# Patient Record
Sex: Male | Born: 2000
Health system: Southern US, Community
[De-identification: ages and names within clinical notes are randomized; demographics above are authoritative.]

## PROBLEM LIST (undated history)

## (undated) DIAGNOSIS — F32A Depression, unspecified: Secondary | ICD-10-CM

## (undated) DIAGNOSIS — J301 Allergic rhinitis due to pollen: Secondary | ICD-10-CM

## (undated) DIAGNOSIS — F329 Major depressive disorder, single episode, unspecified: Secondary | ICD-10-CM

## (undated) DIAGNOSIS — K219 Gastro-esophageal reflux disease without esophagitis: Secondary | ICD-10-CM

## (undated) HISTORY — PX: TONSILLECTOMY AND ADENOIDECTOMY: SUR1326

## (undated) HISTORY — PX: TONSILLECTOMY: SUR1361

## (undated) HISTORY — DX: Depression, unspecified: F32.A

## (undated) HISTORY — DX: Allergic rhinitis due to pollen: J30.1

## (undated) HISTORY — PX: TYMPANOSTOMY TUBE PLACEMENT: SHX32

## (undated) HISTORY — DX: Gastro-esophageal reflux disease without esophagitis: K21.9

---
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
## 2019-01-29 ENCOUNTER — Telehealth: Payer: Self-pay | Admitting: Pediatrics

## 2019-01-29 NOTE — Telephone Encounter (Signed)
Okay to set him up sooner if you can find a spot (I will add on tomorrow --if you don't need to time for other visits)

## 2019-01-29 NOTE — Telephone Encounter (Signed)
I spoke to patient's mother and scheduled appointment on 02/02/19.

## 2019-01-29 NOTE — Telephone Encounter (Signed)
Best number [phone] Mom Walker Shadow ) called   Pt has new patient appointment with you 7/14 and wanted to know if he could be see sooner.  Mom concerned about pt having depression.

## 2019-02-02 ENCOUNTER — Encounter: Payer: Self-pay | Admitting: Internal Medicine

## 2019-02-02 ENCOUNTER — Encounter (INDEPENDENT_AMBULATORY_CARE_PROVIDER_SITE_OTHER): Payer: Self-pay

## 2019-02-02 ENCOUNTER — Other Ambulatory Visit: Payer: Self-pay

## 2019-02-02 ENCOUNTER — Other Ambulatory Visit (HOSPITAL_COMMUNITY): Payer: Self-pay | Admitting: Gastroenterology

## 2019-02-02 ENCOUNTER — Ambulatory Visit (INDEPENDENT_AMBULATORY_CARE_PROVIDER_SITE_OTHER): Payer: 59 | Admitting: Internal Medicine

## 2019-02-02 DIAGNOSIS — Z0000 Encounter for general adult medical examination without abnormal findings: Secondary | ICD-10-CM | POA: Diagnosis not present

## 2019-02-02 DIAGNOSIS — K219 Gastro-esophageal reflux disease without esophagitis: Secondary | ICD-10-CM | POA: Diagnosis not present

## 2019-02-02 DIAGNOSIS — F330 Major depressive disorder, recurrent, mild: Secondary | ICD-10-CM | POA: Diagnosis not present

## 2019-02-02 DIAGNOSIS — R1011 Right upper quadrant pain: Secondary | ICD-10-CM

## 2019-02-02 DIAGNOSIS — F339 Major depressive disorder, recurrent, unspecified: Secondary | ICD-10-CM | POA: Insufficient documentation

## 2019-02-02 MED ORDER — FLUOXETINE HCL 20 MG PO TABS: 20.0000 mg | ORAL_TABLET | Freq: Every day | ORAL | 3 refills | Status: DC

## 2019-02-02 NOTE — Patient Instructions (Signed)
Please start the fluoxetine 20mg  daily. Let me know if you have problems or can't keep taking it. Stop it if you have thoughts of suicide. Bring in a copy of your recent blood work to the next appointment

## 2019-02-02 NOTE — Assessment & Plan Note (Signed)
Healthy Td probably due in 1-2 years Recommended yearly flu vaccine Just had blood work---probably should be rescreened for HIV

## 2019-02-02 NOTE — Progress Notes (Signed)
Subjective:    Patient ID: Tyler Sosa, male    DOB: [birth year], 18 y.o.   MRN: [id number]  HPI Visit to establish care and evaluate depression  Hasn't seen doctor for some years Feels he needs an antidepressant Feels lonely at times---"I over exaggerate" Feels depressed at times---not happy even when things are good At least a couple of times a week Started 2 months ago---really stressed out about stuff Some mood issues in the past---even worse in high school  Graduated high school Went to [employer]. 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 "It would be easier, so I don't have worry about stuff". Happens a couple times a week  In relationship This is positive---5 months now No sexual problems --she is on birth control (no condoms) Was tested for HIV 1-2 years ago No drugs. Rare alcohol. Smoke Jule for about a month---nothing since  No current outpatient medications on file prior to visit.   No current facility-administered medications on file prior to visit.     No Known Allergies  Past Medical History:  Diagnosis Date  . Depression   . GERD (gastroesophageal reflux disease)   . Seasonal allergic rhinitis due to pollen     Past Surgical History:  Procedure Laterality Date  . TONSILLECTOMY    . TONSILLECTOMY AND ADENOIDECTOMY  ~2004  . TYMPANOSTOMY TUBE PLACEMENT      Family History  Problem Relation Age of Onset  . Asthma Mother   . Melanoma Mother   . Hyperlipidemia Father   . Asthma Sister   . Hearing loss Sister     Social History   Socioeconomic History  . Marital status: Single    Spouse name: Not on file  . Number of children: Not on file  .  Years of education: Not on file  . Highest education level: Not on file  Occupational History  . Not on file  Social Needs  . Financial resource strain: Not on file  . Food insecurity    Worry: Not on file    Inability: Not on file  . Transportation needs    Medical: Not on file    Non-medical: Not on file  Tobacco Use  . Smoking status: Never Smoker  . Smokeless tobacco: Never Used  Substance and Sexual Activity  . Alcohol use: No  . Drug use: Not on file  . Sexual activity: Not on file  Lifestyle  . Physical activity    Days per week: Not on file    Minutes per session: Not on file  . Stress: Not on file  Relationships  . Social Herbalist on phone: Not on file    Gets together: Not on file    Attends religious service: Not on file    Active member of club or organization: Not on file    Attends meetings of clubs or organizations: Not on file    Relationship status: Not on file  . Intimate partner violence    Fear of current or ex partner: Not on file    Emotionally abused: Not on file    Physically abused: Not on file    Forced sexual activity: Not on file  Other Topics Concern  . Not on file  Social History Narrative  . Not on file   Review of Systems  Constitutional:       Appetite is variable Weight is up 15# since graduating high school Wears seat belt sporadically  HENT: Negative for dental problem and hearing loss.   Eyes: Negative for visual disturbance.  Respiratory: Negative for cough, chest tightness and shortness of breath.   Cardiovascular: Negative for palpitations and leg swelling.       Some nights ---"like a needle" in upper left chest Saw GI yesterday--told to drink more water (Dr Loreta AveMann)  Gastrointestinal: Positive for constipation. Negative for blood in stool.       Saw GI due to the constipation  Endocrine: Negative for polydipsia and polyuria.  Genitourinary: Negative for difficulty urinating and urgency.  Musculoskeletal:  Negative for arthralgias, gait problem and joint swelling.  Skin: Negative for rash.  Allergic/Immunologic: Positive for environmental allergies. Negative for immunocompromised state.       Loratadine helps  Neurological: Positive for headaches. Negative for dizziness, syncope and light-headedness.  Hematological: Negative for adenopathy. Does not bruise/bleed easily.  Psychiatric/Behavioral: Positive for dysphoric mood. Negative for sleep disturbance. The patient is nervous/anxious.        Objective:   Physical Exam  Constitutional: He is oriented to person, place, and time. He appears well-developed. No distress.  HENT:  Head: Normocephalic and atraumatic.  Right Ear: External ear normal.  Left Ear: External ear normal.  Mouth/Throat: Oropharynx is clear and moist. No oropharyngeal exudate.  Eyes: Pupils are equal, round, and reactive to light. Conjunctivae are normal.  Neck: No thyromegaly present.  Cardiovascular: Normal rate, normal heart sounds and intact distal pulses. Exam reveals no gallop.  No murmur heard. Respiratory: Effort normal and breath sounds normal. No respiratory distress. He has no wheezes. He has no rales.  GI: Soft. There is no abdominal tenderness.  Genitourinary:    Genitourinary Comments: Normal testes   Musculoskeletal:        General: No tenderness or edema.  Lymphadenopathy:    He has no cervical adenopathy.  Neurological: He is alert and oriented to person, place, and time.  Skin: No rash noted. No erythema.  Psychiatric:  No clear depression Affect appropriate No thought process disturbances           Assessment & Plan:

## 2019-02-02 NOTE — Assessment & Plan Note (Signed)
Had more serious issues in high school but wasn't treated No mania Anxious as well Suicidal thought but no ideation---asked him to secure weapons in his home but he will not (does live with parents) Will start fluoxetine and see back soon

## 2019-02-12 ENCOUNTER — Ambulatory Visit (HOSPITAL_COMMUNITY)
Admission: RE | Admit: 2019-02-12 | Discharge: 2019-02-12 | Disposition: A | Discharge status: Home / Self Care | Payer: 59 | Source: Ambulatory Visit | Attending: Gastroenterology | Admitting: Gastroenterology

## 2019-02-12 DIAGNOSIS — R1011 Right upper quadrant pain: Secondary | ICD-10-CM | POA: Diagnosis present

## 2019-02-20 ENCOUNTER — Ambulatory Visit: Payer: 59 | Admitting: Internal Medicine

## 2019-02-20 ENCOUNTER — Encounter: Payer: 59 | Admitting: Internal Medicine

## 2019-06-27 ENCOUNTER — Other Ambulatory Visit: Payer: Self-pay

## 2019-06-27 DIAGNOSIS — Z20822 Contact with and (suspected) exposure to covid-19: Secondary | ICD-10-CM

## 2019-06-28 ENCOUNTER — Encounter: Payer: Self-pay | Admitting: Emergency Medicine

## 2019-06-28 ENCOUNTER — Other Ambulatory Visit: Payer: Self-pay

## 2019-06-28 ENCOUNTER — Telehealth: Payer: Self-pay

## 2019-06-28 ENCOUNTER — Ambulatory Visit
Admission: EM | Admit: 2019-06-28 | Discharge: 2019-06-28 | Disposition: A | Discharge status: Home / Self Care | Payer: 59 | Attending: Emergency Medicine | Admitting: Emergency Medicine

## 2019-06-28 DIAGNOSIS — R05 Cough: Secondary | ICD-10-CM | POA: Diagnosis not present

## 2019-06-28 DIAGNOSIS — L239 Allergic contact dermatitis, unspecified cause: Secondary | ICD-10-CM

## 2019-06-28 DIAGNOSIS — R059 Cough, unspecified: Secondary | ICD-10-CM

## 2019-06-28 MED ORDER — GUAIFENESIN ER 600 MG PO TB12: 1200.0000 mg | ORAL_TABLET | Freq: Two times a day (BID) | ORAL | 0 refills | Status: DC | PRN

## 2019-06-28 MED ORDER — PREDNISONE 10 MG (21) PO TBPK: ORAL_TABLET | Freq: Every day | ORAL | 0 refills | Status: DC

## 2019-06-28 NOTE — Telephone Encounter (Signed)
Okay Sounds like it might be a contact dermatitis. Will await the visit information from the urgent care

## 2019-06-28 NOTE — Telephone Encounter (Signed)
Pt's mom said; starting last night pt has bumpy red rash on face and lt eye,chest,waistline,neck,thigh and penis. No difficulty breathing and no swelling with lips,tongue,mouth or throat. Pt did cut wood 5 days ago. Not sure if looks like poison oak or ivy. All areas with rash are itching. Pt just took benadryl. Pt had covid testing on 06/27/19 due to cough and congestion; no results yet. Pt has dry cough,runny nose,H/A,S/T, eye is red on outside and pink eye in corner of lt eye. pts mom is going to take pt to [facility] in [place] now.FYI to Dr Silvio Pate.

## 2019-06-28 NOTE — ED Provider Notes (Signed)
Renaldo Fiddler    CSN: [id number] Arrival date & time: 06/28/19  1410      History   Chief Complaint Chief Complaint  Patient presents with  . Rash  . Cough    HPI Tyler Sosa is a 18 y.o. male.  Patient presents with pruritic rash on his face and chest x 4 days.  He states the rash began after he was in the woods.  He also reports nasal congestion and a nonproductive cough.  He had a COVID test done yesterday and the result pending.  He denies fever, chills, ear pain, sore throat, shortness of breath, vomiting, diarrhea, or other symptoms.  His mother reports she gave him 1 dose of Benadryl today.  Prior to that, he was treating his rash with rubbing alcohol.  No treatments attempted for congestion or cough.    The history is provided by the patient and a parent.    Past Medical History:  Diagnosis Date  . Depression   . GERD (gastroesophageal reflux disease)   . Seasonal allergic rhinitis due to pollen     Patient Active Problem List   Diagnosis Date Noted  . Preventative health care 02/02/2019  . Recurrent major depression (HCC) 02/02/2019  . GERD (gastroesophageal reflux disease)     Past Surgical History:  Procedure Laterality Date  . TONSILLECTOMY    . TONSILLECTOMY AND ADENOIDECTOMY  ~2004  . TYMPANOSTOMY TUBE PLACEMENT         Home Medications    Prior to Admission medications   Medication Sig Start Date End Date Taking? Authorizing Provider  FLUoxetine (PROZAC) 20 MG tablet Take 1 tablet (20 mg total) by mouth daily. 02/02/19   Karie Schwalbe, MD  guaiFENesin (MUCINEX) 600 MG 12 hr tablet Take 2 tablets (1,200 mg total) by mouth 2 (two) times daily as needed. 06/28/19   Mickie Bail, NP  predniSONE (STERAPRED UNI-PAK 21 TAB) 10 MG (21) TBPK tablet Take by mouth daily. Take 6 tabs by mouth daily  for 1 day, then 5 tabs for 1 day, then 4 tabs for 1 day, then 3 tabs for 1 day, 2 tabs for 1 day, then 1 tab by mouth daily for 1 day 06/28/19    Mickie Bail, NP    Family History Family History  Problem Relation Age of Onset  . Asthma Mother   . Melanoma Mother   . Hyperlipidemia Father   . Asthma Sister   . Hearing loss Sister     Social History Social History   Tobacco Use  . Smoking status: Never Smoker  . Smokeless tobacco: Never Used  Substance Use Topics  . Alcohol use: Yes    Comment: rare beer  . Drug use: Never     Allergies   Patient has no known allergies.   Review of Systems Review of Systems  Constitutional: Negative for chills and fever.  HENT: Positive for congestion. Negative for ear pain and sore throat.   Eyes: Negative for pain and visual disturbance.  Respiratory: Positive for cough. Negative for shortness of breath.   Cardiovascular: Negative for chest pain and palpitations.  Gastrointestinal: Negative for abdominal pain, diarrhea, nausea and vomiting.  Genitourinary: Negative for dysuria and hematuria.  Musculoskeletal: Negative for arthralgias and back pain.  Skin: Positive for rash. Negative for color change.  Neurological: Negative for seizures and syncope.  All other systems reviewed and are negative.    Physical Exam Triage Vital Signs ED Triage Vitals  Enc Vitals Group     BP      Pulse      Resp      Temp      Temp src      SpO2      Weight      Height      Head Circumference      Peak Flow      Pain Score      Pain Loc      Pain Edu?      Excl. in GC?    No data found.  Updated Vital Signs BP 126/79   Pulse 92   Temp 98 F (36.7 C) (Oral)   Resp 18   Wt 172 lb (78 kg)   SpO2 96%   BMI 26.94 kg/m   Visual Acuity Right Eye Distance:   Left Eye Distance:   Bilateral Distance:    Right Eye Near:   Left Eye Near:    Bilateral Near:     Physical Exam Vitals signs and nursing note reviewed.  Constitutional:      General: He is not in acute distress.    Appearance: He is well-developed. He is not ill-appearing.  HENT:     Head: Normocephalic  and atraumatic.     Right Ear: Tympanic membrane normal.     Left Ear: Tympanic membrane normal.     Nose: Nose normal.     Mouth/Throat:     Mouth: Mucous membranes are moist.     Pharynx: Oropharynx is clear.  Eyes:     Conjunctiva/sclera: Conjunctivae normal.  Neck:     Musculoskeletal: Neck supple.  Cardiovascular:     Rate and Rhythm: Normal rate and regular rhythm.     Heart sounds: No murmur.  Pulmonary:     Effort: Pulmonary effort is normal. No respiratory distress.     Breath sounds: Normal breath sounds. No wheezing or rhonchi.  Abdominal:     General: Bowel sounds are normal.     Palpations: Abdomen is soft.     Tenderness: There is no abdominal tenderness. There is no guarding or rebound.  Skin:    General: Skin is warm and dry.     Findings: Rash present.     Comments: Pink linear papular/plaque rash on facial cheeks, forehead, and upper chest.  No drainage.  No lesions in eyes or mouth.    Neurological:     General: No focal deficit present.     Mental Status: He is alert and oriented to person, place, and time.     Sensory: No sensory deficit.     Motor: No weakness.     Gait: Gait normal.      UC Treatments / Results  Labs (all labs ordered are listed, but only abnormal results are displayed) Labs Reviewed - No data to display  EKG   Radiology No results found.  Procedures Procedures (including critical care time)  Medications Ordered in UC Medications - No data to display  Initial Impression / Assessment and Plan / UC Course  I have reviewed the triage vital signs and the nursing notes.  Pertinent labs & imaging results that were available during my care of the patient were reviewed by me and considered in my medical decision making (see chart for details).    Allergic contact dermatitis.  Cough.  Treating dermatitis with prednisone and Benadryl.  Treating cough with Mucinex.  Instructed patient to continue to self quarantine until his COVID  test result is back.  Instructed him to follow-up with his PCP or return here if his symptoms or not improving.  Patient agrees to plan of care.     Final Clinical Impressions(s) / UC Diagnoses   Final diagnoses:  Allergic contact dermatitis, unspecified trigger  Cough     Discharge Instructions     Take the prednisone as directed.  You also should take Benadryl for your rash.  Remember that Benadryl may cause drowsiness so do not drive, operate machinery, or drink alcohol with this medication.    Take the Mucinex as needed for your cough.  Continue to self quarantine until your COVID test result is back.    Follow-up with your primary care provider or return here if your symptoms are not improving.       ED Prescriptions    Medication Sig Dispense Auth. Provider   guaiFENesin (MUCINEX) 600 MG 12 hr tablet Take 2 tablets (1,200 mg total) by mouth 2 (two) times daily as needed. 12 tablet Sharion Balloon, NP   predniSONE (STERAPRED UNI-PAK 21 TAB) 10 MG (21) TBPK tablet Take by mouth daily. Take 6 tabs by mouth daily  for 1 day, then 5 tabs for 1 day, then 4 tabs for 1 day, then 3 tabs for 1 day, 2 tabs for 1 day, then 1 tab by mouth daily for 1 day 21 tablet Sharion Balloon, NP     PDMP not reviewed this encounter.   Sharion Balloon, NP 06/28/19 1451

## 2019-06-28 NOTE — Discharge Instructions (Signed)
Take the prednisone as directed.  You also should take Benadryl for your rash.  Remember that Benadryl may cause drowsiness so do not drive, operate machinery, or drink alcohol with this medication.    Take the Mucinex as needed for your cough.  Continue to self quarantine until your COVID test result is back.    Follow-up with your primary care provider or return here if your symptoms are not improving.

## 2019-06-28 NOTE — ED Triage Notes (Signed)
Patient in office with mom c/o ? Poison Ivy always go into the woods hunting also complains of cough/cough had covid do yesterday @ [facility]  BTY:OMAYOKHT  Denies:Fever

## 2019-06-29 ENCOUNTER — Telehealth: Payer: Self-pay | Admitting: *Deleted

## 2019-06-29 NOTE — Telephone Encounter (Signed)
Patient called for results ,advised to call back due results still pending.

## 2019-06-30 LAB — NOVEL CORONAVIRUS, NAA: SARS-CoV-2, NAA: NOT DETECTED

## 2020-03-16 IMAGING — US ULTRASOUND ABDOMEN COMPLETE
1 series · 14 of 25 positions shown · non-contrast
Comparison: None.

CLINICAL DATA: Right upper quadrant pain

EXAM:
ULTRASOUND ABDOMEN LIMITED RIGHT UPPER QUADRANT

[Series 1: ultrasound abdomen complete · 14 of 35 slices shown]
[im 1/35]
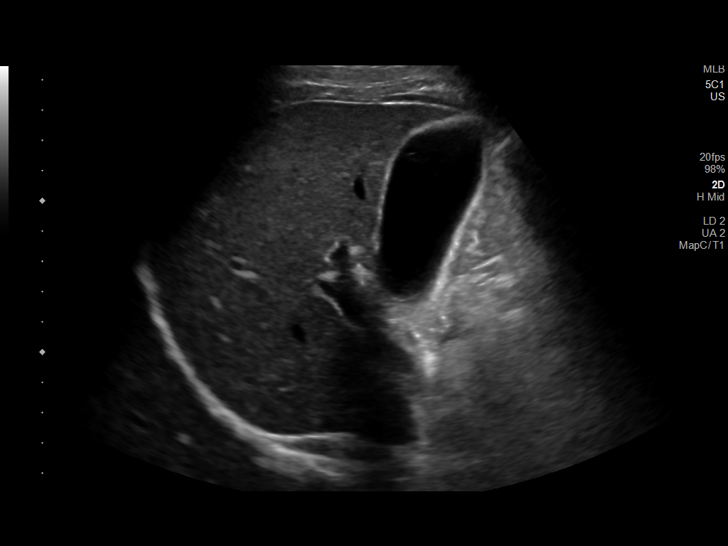
[im 3/35]
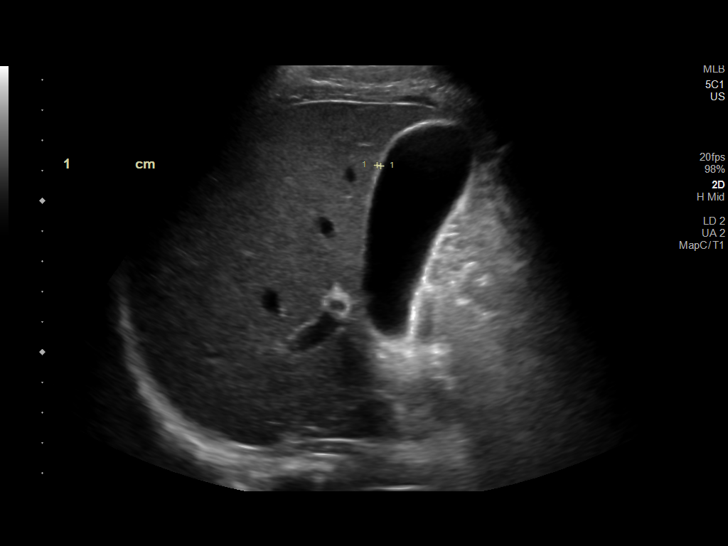
[im 6/35]
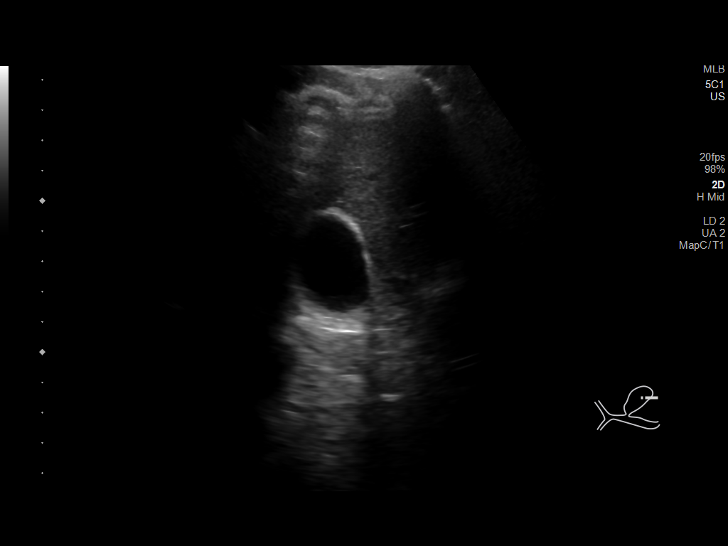
[im 9/35]
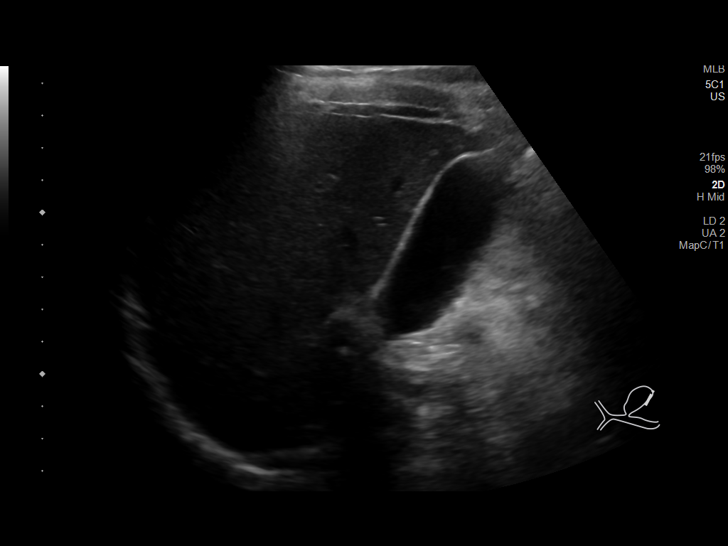
[im 12/35]
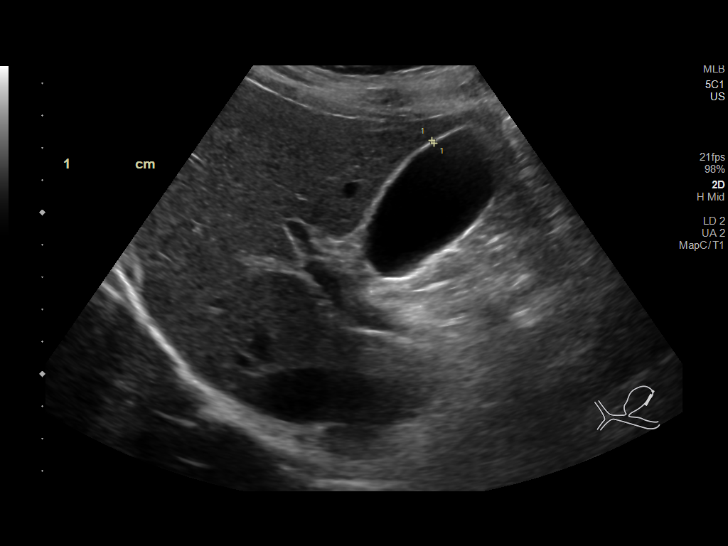
[im 13/35]
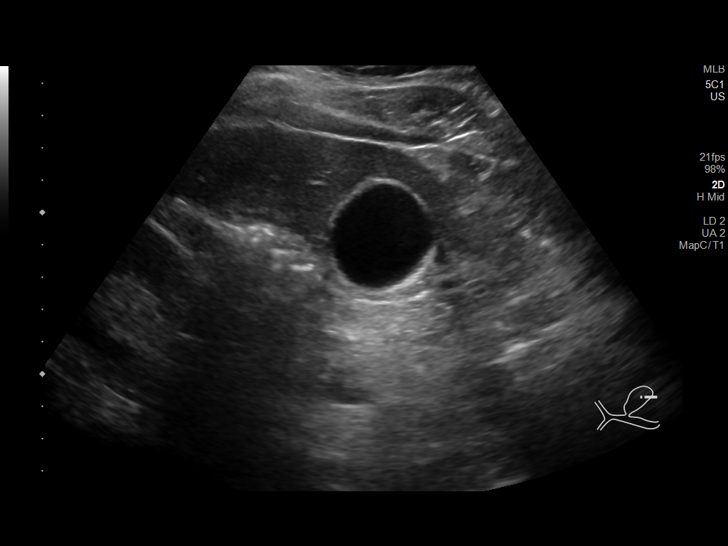
[im 16/35]
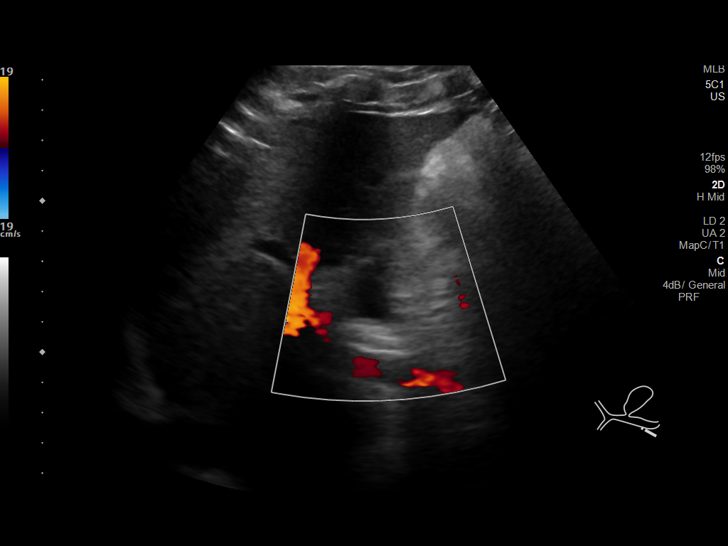
[im 19/35]
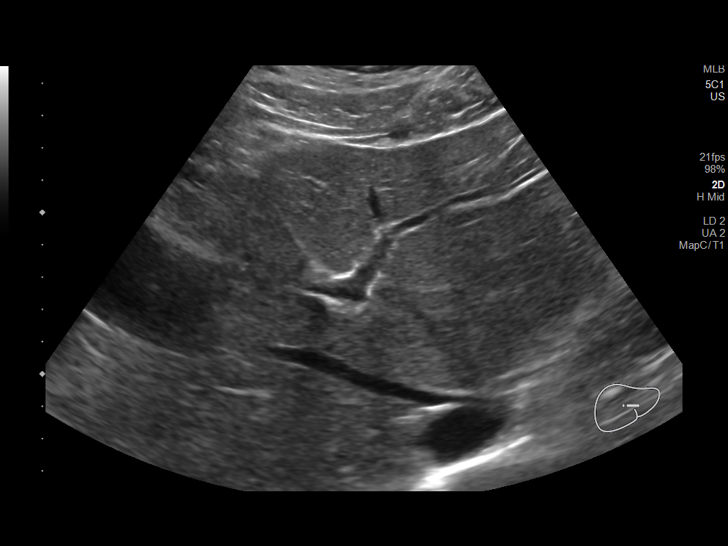
[im 22/35]
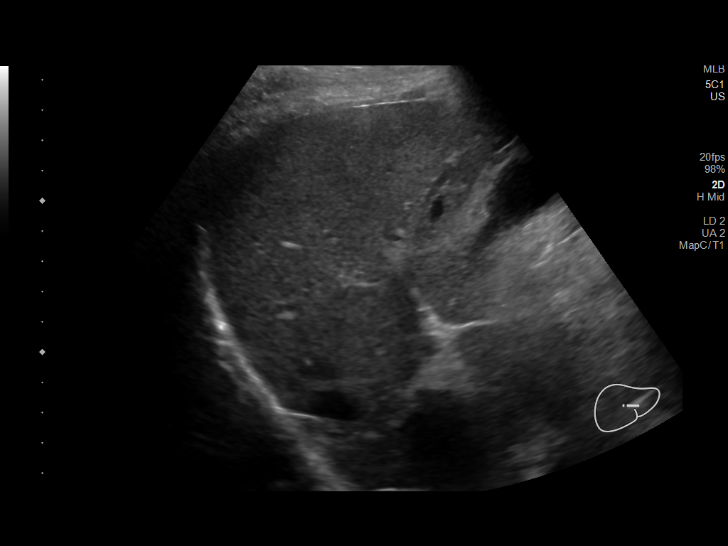
[im 23/35]
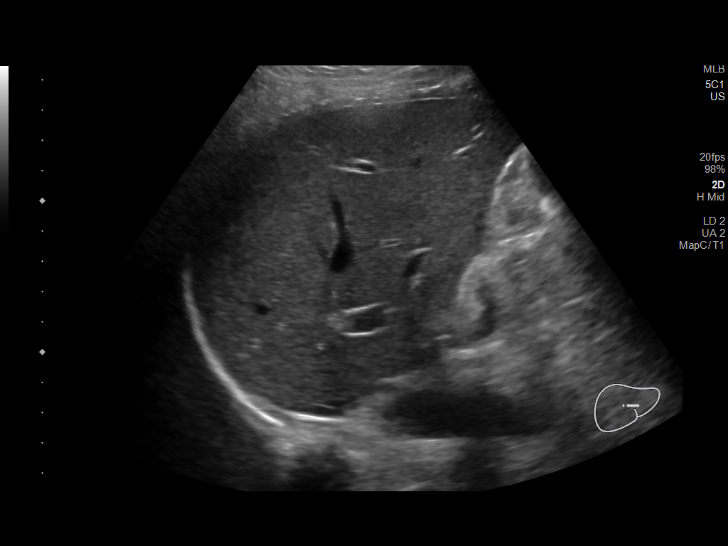
[im 26/35]
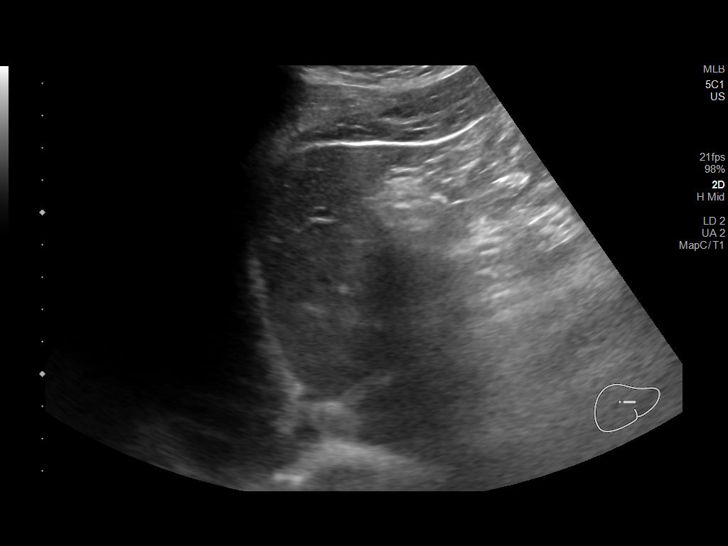
[im 29/35]
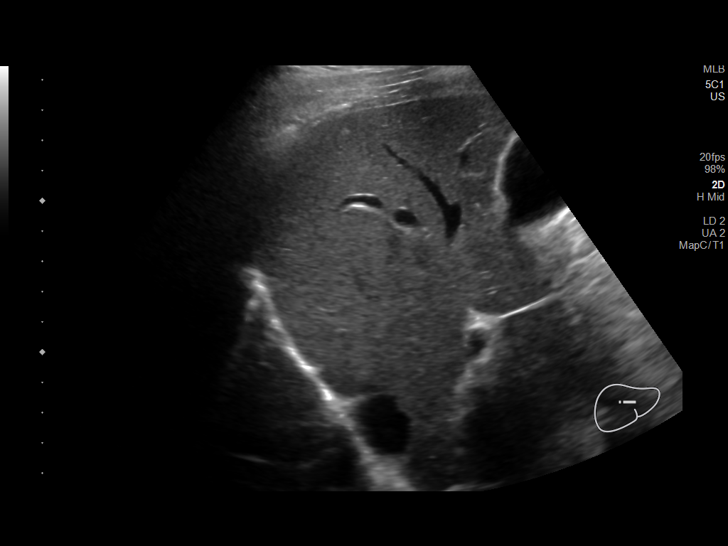
[im 32/35]
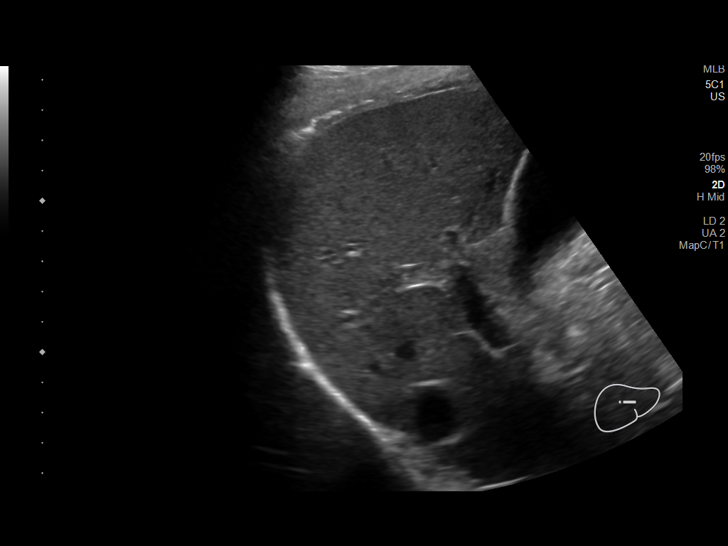
[im 35/35]
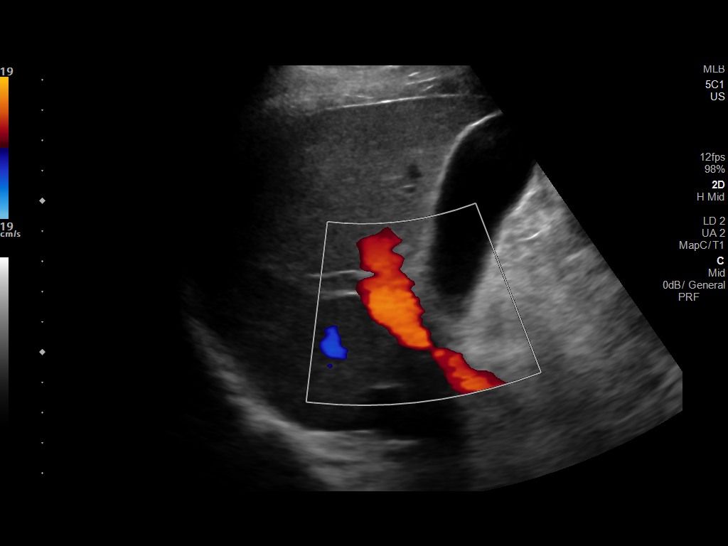

[14 of 25 positions shown; findings below may reference images not displayed]

FINDINGS: Gallbladder:

No gallstones or wall thickening visualized. There is no
pericholecystic fluid no sonographic Murphy sign noted by
sonographer.

Common bile duct:

Diameter: 4 mm. No intrahepatic or extrahepatic biliary duct
dilatation.

Liver:

No focal lesion identified. Within normal limits in parenchymal
echogenicity. Portal vein is patent on color Doppler imaging with
normal direction of blood flow towards the liver.
IMPRESSION: Study within normal limits.

## 2020-10-13 DIAGNOSIS — S63642A Sprain of metacarpophalangeal joint of left thumb, initial encounter: Secondary | ICD-10-CM | POA: Insufficient documentation

## 2021-10-06 ENCOUNTER — Ambulatory Visit (INDEPENDENT_AMBULATORY_CARE_PROVIDER_SITE_OTHER): Payer: 59 | Admitting: Internal Medicine

## 2021-10-06 ENCOUNTER — Other Ambulatory Visit: Payer: Self-pay

## 2021-10-06 ENCOUNTER — Encounter: Payer: Self-pay | Admitting: Internal Medicine

## 2021-10-06 VITALS — BP 104/68 | HR 78 | Temp 97.8°F | Ht 68.0 in | Wt 187.0 lb

## 2021-10-06 DIAGNOSIS — Z0000 Encounter for general adult medical examination without abnormal findings: Secondary | ICD-10-CM | POA: Diagnosis not present

## 2021-10-06 DIAGNOSIS — Z23 Encounter for immunization: Secondary | ICD-10-CM | POA: Diagnosis not present

## 2021-10-06 NOTE — Assessment & Plan Note (Signed)
Healthy No problems with proceeding with BLET program Forms done Stays fit Prefers no COVID vaccines--or flu vaccines Td today

## 2021-10-06 NOTE — Progress Notes (Signed)
Subjective:    Patient ID: Tyler Sosa, male    DOB: [birth year], 21 y.o.   MRN: [id number]  HPI Here for physical  Plans to start BLET at [employer] In application process for [facility] Department  No recurrence of mood problems Currently at [employer] on line---Bachelor's in Criminal Justice Part time landscaping No sexuality issues Still living with parents  No current outpatient medications on file prior to visit.   No current facility-administered medications on file prior to visit.    No Known Allergies  Past Medical History:  Diagnosis Date   Depression    GERD (gastroesophageal reflux disease)    Seasonal allergic rhinitis due to pollen     Past Surgical History:  Procedure Laterality Date   TONSILLECTOMY     TONSILLECTOMY AND ADENOIDECTOMY  ~2004   TYMPANOSTOMY TUBE PLACEMENT      Family History  Problem Relation Age of Onset   Asthma Mother    Melanoma Mother    Hyperlipidemia Father    Asthma Sister    Hearing loss Sister     Social History   Socioeconomic History   Marital status: Single    Spouse name: Not on file   Number of children: Not on file   Years of education: Not on file   Highest education level: Not on file  Occupational History   Not on file  Tobacco Use   Smoking status: Never    Passive exposure: Never   Smokeless tobacco: Never  Substance and Sexual Activity   Alcohol use: Yes    Comment: rare beer   Drug use: Never   Sexual activity: Yes    Partners: Female  Other Topics Concern   Not on file  Social History Narrative   Lives with mom and step dad   Doesn't really know his biologic dad anymore   1 sister   Hopes to go into Patent examiner   Social Determinants of Health   Financial Resource Strain: Not on file  Food Insecurity: Not on file  Transportation Needs: Not on file  Physical Activity: Not on file  Stress: Not on file  Social Connections: Not on file  Intimate Partner Violence: Not on file    Review of Systems  Constitutional:  Negative for fatigue and unexpected weight change.       Works out regularly---runs and [employer] Wears seat belt  HENT:  Negative for dental problem, hearing loss, tinnitus and trouble swallowing.        Regular with dentist   Eyes:  Negative for visual disturbance.       No diplopia or unilateral vision loss   Respiratory:  Negative for cough, chest tightness and shortness of breath.   Cardiovascular:  Negative for chest pain, palpitations and leg swelling.  Gastrointestinal:  Negative for blood in stool and constipation.       Occasional heartburn--- famotidine prn (not often)  Endocrine: Negative for polydipsia and polyuria.  Genitourinary:  Negative for difficulty urinating and dysuria.       No sexual problems---monogamous   Musculoskeletal:  Negative for arthralgias, back pain and joint swelling.  Skin:  Negative for rash.  Allergic/Immunologic: Positive for environmental allergies. Negative for immunocompromised state.       Uses occ OTC meds in season  Neurological:  Negative for dizziness, syncope, light-headedness and headaches.  Hematological:  Negative for adenopathy. Does not bruise/bleed easily.  Psychiatric/Behavioral:  Negative for dysphoric mood and sleep disturbance. The patient  is not nervous/anxious.       Objective:   Physical Exam Constitutional:      Appearance: Normal appearance.  HENT:     Mouth/Throat:     Pharynx: No oropharyngeal exudate or posterior oropharyngeal erythema.  Eyes:     Conjunctiva/sclera: Conjunctivae normal.     Pupils: Pupils are equal, round, and reactive to light.  Cardiovascular:     Rate and Rhythm: Normal rate and regular rhythm.     Pulses: Normal pulses.     Heart sounds: No murmur heard.   No gallop.  Pulmonary:     Effort: Pulmonary effort is normal.     Breath sounds: Normal breath sounds. No wheezing or rales.  Abdominal:     Palpations: Abdomen is soft.     Tenderness:  There is no abdominal tenderness.  Genitourinary:    Testes: Normal.  Musculoskeletal:     Cervical back: Neck supple.     Right lower leg: No edema.     Left lower leg: No edema.  Lymphadenopathy:     Cervical: No cervical adenopathy.  Skin:    Findings: No rash.  Neurological:     General: No focal deficit present.     Mental Status: He is alert and oriented to person, place, and time.  Psychiatric:        Mood and Affect: Mood normal.        Behavior: Behavior normal.           Assessment & Plan:

## 2021-10-06 NOTE — Addendum Note (Signed)
Addended by: Eual Fines on: 10/06/2021 09:49 AM   Modules accepted: Orders

## 2021-12-31 ENCOUNTER — Ambulatory Visit: Payer: Self-pay

## 2021-12-31 ENCOUNTER — Ambulatory Visit: Payer: Self-pay | Admitting: Physician Assistant

## 2021-12-31 ENCOUNTER — Encounter: Payer: Self-pay | Admitting: Physician Assistant

## 2021-12-31 VITALS — BP 123/78 | HR 86 | Temp 97.7°F | Resp 12 | Ht 68.0 in | Wt 185.0 lb

## 2021-12-31 DIAGNOSIS — R140 Abdominal distension (gaseous): Secondary | ICD-10-CM | POA: Insufficient documentation

## 2021-12-31 DIAGNOSIS — R1011 Right upper quadrant pain: Secondary | ICD-10-CM | POA: Insufficient documentation

## 2021-12-31 DIAGNOSIS — R7401 Elevation of levels of liver transaminase levels: Secondary | ICD-10-CM | POA: Insufficient documentation

## 2021-12-31 DIAGNOSIS — Z021 Encounter for pre-employment examination: Secondary | ICD-10-CM

## 2021-12-31 DIAGNOSIS — R635 Abnormal weight gain: Secondary | ICD-10-CM | POA: Insufficient documentation

## 2021-12-31 LAB — POCT URINALYSIS DIPSTICK
Bilirubin, UA: NEGATIVE
Blood, UA: NEGATIVE
Glucose, UA: NEGATIVE
Ketones, UA: NEGATIVE
Leukocytes, UA: NEGATIVE
Nitrite, UA: NEGATIVE
Protein, UA: NEGATIVE
Spec Grav, UA: 1.01 (ref 1.010–1.025)
Urobilinogen, UA: 0.2 E.U./dL
pH, UA: 6 (ref 5.0–8.0)

## 2021-12-31 NOTE — Progress Notes (Signed)
Pt presents today to complete employment  physical, BLET

## 2021-12-31 NOTE — Progress Notes (Signed)
[facility]  ____________________________________________   None    (approximate)  I have reviewed the triage vital signs and the nursing notes.   HISTORY  Chief Complaint Employment Physical   HPI Tyler Sosa is a 21 y.o. male presents for employment physical for [employer].  Patient voices no concerns or complaints.  Past medical history is remarkable for depression, GERD, and rhinitis.         Past Medical History:  Diagnosis Date   Depression    GERD (gastroesophageal reflux disease)    Seasonal allergic rhinitis due to pollen     Patient Active Problem List   Diagnosis Date Noted   Abdominal bloating 12/31/2021   Abnormal weight gain 12/31/2021   Elevated levels of transaminase & lactic acid dehydrogenase 12/31/2021   Right upper quadrant pain 12/31/2021   Sprain of ulnar collateral ligament of metacarpophalangeal (MCP) joint of left thumb 10/13/2020   Preventative health care 02/02/2019   Gastroesophageal reflux disease     Past Surgical History:  Procedure Laterality Date   TONSILLECTOMY     TONSILLECTOMY AND ADENOIDECTOMY  ~2004   TYMPANOSTOMY TUBE PLACEMENT      Prior to Admission medications   Not on File    Allergies Patient has no known allergies.  Family History  Problem Relation Age of Onset   Asthma Mother    Melanoma Mother    Hyperlipidemia Father    Asthma Sister    Hearing loss Sister     Social History Social History   Tobacco Use   Smoking status: Never    Passive exposure: Never   Smokeless tobacco: Never   Tobacco comments:    Nicotine pouches.   Substance Use Topics   Alcohol use: Yes    Comment: rare beer   Drug use: Never    Review of Systems Constitutional: No fever/chills Eyes: No visual changes. ENT: No sore throat. Cardiovascular: Denies chest pain. Respiratory: Denies shortness of breath. Gastrointestinal: No abdominal pain.  No nausea, no vomiting.  No  diarrhea.  No constipation. Genitourinary: Negative for dysuria. Musculoskeletal: Negative for back pain. Skin: Negative for rash. Neurological: Negative for headaches, focal weakness or numbness.  ____________________________________________   PHYSICAL EXAM:  VITAL SIGNS: BP 123/78, pulse 86, respiration 12, temperature 97.7, patient 98% with room air.  Patient weighs on 85 pounds and BMI is 28.13. Constitutional: Alert and oriented. Well appearing and in no acute distress. Eyes: Conjunctivae are normal. PERRL. EOMI. Head: Atraumatic. Nose: No congestion/rhinnorhea. Mouth/Throat: Mucous membranes are moist.  Oropharynx non-erythematous. Neck: No stridor.  No cervical spine tenderness to palpation. Hematological/Lymphatic/Immunilogical: No cervical lymphadenopathy. Cardiovascular: Normal rate, regular rhythm. Grossly normal heart sounds.  Good peripheral circulation. Respiratory: Normal respiratory effort.  No retractions. Lungs CTAB. Gastrointestinal: Soft and nontender. No distention. No abdominal bruits. No CVA tenderness. Genitourinary: Deferred Musculoskeletal: No lower extremity tenderness nor edema.  No joint effusions. Neurologic:  Normal speech and language. No gross focal neurologic deficits are appreciated. No gait instability. Skin:  Skin is warm, dry and intact. No rash noted. Psychiatric: Mood and affect are normal. Speech and behavior are normal.  ____________________________________________   LABS      Component Ref Range & Units 14:17  Color, UA  transparent   Clarity, UA  clear   Glucose, UA Negative Negative   Bilirubin, UA  negative   Ketones, UA  negative   Spec Grav, UA 1.010 - 1.025 1.010   Blood, UA  negative  pH, UA 5.0 - 8.0 6.0   Protein, UA Negative Negative   Urobilinogen, UA 0.2 or 1.0 E.U./dL 0.2   Nitrite, UA  negative   Leukocytes, UA Negative Negative   Appearance           __________________________________________    ____________________________________________   ____________________________________________   INITIAL IMPRESSION / ASSESSMENT AND PLAN  As part of my medical decision making, I reviewed the following data within the electronic MEDICAL RECORD NUMBER Notes from prior ED visits and [place] Controlled Substance Database     Advise patient labs are pending.     _____________________________________   FINAL CLINICAL IMPRESSION  Well exam   ED Discharge Orders     None        Note:  This document was prepared using Dragon voice recognition software and may include unintentional dictation errors.

## 2021-12-31 NOTE — Progress Notes (Signed)
Pt presents today to complete pre-employment physical.

## 2022-01-08 LAB — CMP12+LP+TP+TSH+6AC+CBC/D/PLT
ALT: 32 IU/L (ref 0–44)
AST: 26 IU/L (ref 0–40)
Albumin/Globulin Ratio: 2 (ref 1.2–2.2)
Albumin: 4.7 g/dL (ref 4.1–5.2)
Alkaline Phosphatase: 91 IU/L (ref 44–121)
BUN/Creatinine Ratio: 14 (ref 9–20)
BUN: 15 mg/dL (ref 6–20)
Basophils Absolute: 0.1 10*3/uL (ref 0.0–0.2)
Basos: 1 %
Bilirubin Total: 0.3 mg/dL (ref 0.0–1.2)
Calcium: 9.7 mg/dL (ref 8.7–10.2)
Chloride: 101 mmol/L (ref 96–106)
Chol/HDL Ratio: 4.3 ratio (ref 0.0–5.0)
Cholesterol, Total: 203 mg/dL — ABNORMAL HIGH (ref 100–199)
Creatinine, Ser: 1.07 mg/dL (ref 0.76–1.27)
EOS (ABSOLUTE): 0.4 10*3/uL (ref 0.0–0.4)
Eos: 7 %
Estimated CHD Risk: 0.8 times avg. (ref 0.0–1.0)
Free Thyroxine Index: 1.5 (ref 1.2–4.9)
GGT: 28 IU/L (ref 0–65)
Globulin, Total: 2.4 g/dL (ref 1.5–4.5)
Glucose: 64 mg/dL — ABNORMAL LOW (ref 70–99)
HDL: 47 mg/dL (ref >39)
Hematocrit: 46.8 % (ref 37.5–51.0)
Hemoglobin: 16.1 g/dL (ref 13.0–17.7)
Immature Grans (Abs): 0 10*3/uL (ref 0.0–0.1)
Immature Granulocytes: 0 %
Iron: 81 ug/dL (ref 38–169)
LDH: 175 IU/L (ref 121–224)
LDL Chol Calc (NIH): 136 mg/dL — ABNORMAL HIGH (ref 0–99)
Lymphocytes Absolute: 2 10*3/uL (ref 0.7–3.1)
Lymphs: 34 %
MCH: 28.6 pg (ref 26.6–33.0)
MCHC: 34.4 g/dL (ref 31.5–35.7)
MCV: 83 fL (ref 79–97)
Monocytes Absolute: 0.6 10*3/uL (ref 0.1–0.9)
Monocytes: 10 %
Neutrophils Absolute: 2.9 10*3/uL (ref 1.4–7.0)
Neutrophils: 48 %
Phosphorus: 4 mg/dL (ref 2.8–4.1)
Platelets: 283 10*3/uL (ref 150–450)
Potassium: 4.3 mmol/L (ref 3.5–5.2)
RBC: 5.62 x10E6/uL (ref 4.14–5.80)
RDW: 12.8 % (ref 11.6–15.4)
Sodium: 140 mmol/L (ref 134–144)
T3 Uptake Ratio: 24 % (ref 24–39)
T4, Total: 6.3 ug/dL (ref 4.5–12.0)
TSH: 1.68 u[IU]/mL (ref 0.450–4.500)
Total Protein: 7.1 g/dL (ref 6.0–8.5)
Triglycerides: 113 mg/dL (ref 0–149)
Uric Acid: 6.7 mg/dL (ref 3.8–8.4)
VLDL Cholesterol Cal: 20 mg/dL (ref 5–40)
WBC: 5.9 10*3/uL (ref 3.4–10.8)
eGFR: 101 mL/min/{1.73_m2} (ref >59)

## 2022-01-08 LAB — QUANTIFERON-TB GOLD PLUS

## 2022-01-08 LAB — HEPATITIS B SURFACE ANTIBODY,QUALITATIVE: Hep B Surface Ab, Qual: REACTIVE

## 2022-01-11 ENCOUNTER — Other Ambulatory Visit: Payer: Self-pay

## 2022-01-11 DIAGNOSIS — Z0184 Encounter for antibody response examination: Secondary | ICD-10-CM

## 2022-01-11 NOTE — Progress Notes (Signed)
Pt completed lab redraw for TB gold.

## 2022-01-13 LAB — QUANTIFERON-TB GOLD PLUS
QuantiFERON Mitogen Value: >10 IU/mL
QuantiFERON Nil Value: 0.48 IU/mL
QuantiFERON TB1 Ag Value: 0.44 IU/mL
QuantiFERON TB2 Ag Value: 0.52 IU/mL
QuantiFERON-TB Gold Plus: NEGATIVE

## 2022-07-23 ENCOUNTER — Other Ambulatory Visit: Payer: Self-pay

## 2022-07-23 DIAGNOSIS — Z0283 Encounter for blood-alcohol and blood-drug test: Secondary | ICD-10-CM

## 2022-07-23 NOTE — Progress Notes (Signed)
[facility] Acct #:  [id number] [facility] Specimen #:  [id number]  Rapid drug screen results = Negative  AMD

## 2022-11-12 ENCOUNTER — Ambulatory Visit: Payer: Self-pay

## 2022-11-12 DIAGNOSIS — Z0000 Encounter for general adult medical examination without abnormal findings: Secondary | ICD-10-CM

## 2022-11-12 NOTE — Progress Notes (Signed)
Labs completed for physical.

## 2022-11-13 LAB — CMP12+LP+TP+TSH+6AC+CBC/D/PLT
ALT: 21 IU/L (ref 0–44)
AST: 20 IU/L (ref 0–40)
Albumin/Globulin Ratio: 1.7 (ref 1.2–2.2)
Albumin: 4.6 g/dL (ref 4.3–5.2)
Alkaline Phosphatase: 81 IU/L (ref 44–121)
BUN/Creatinine Ratio: 8 — ABNORMAL LOW (ref 9–20)
BUN: 9 mg/dL (ref 6–20)
Basophils Absolute: 0 10*3/uL (ref 0.0–0.2)
Basos: 1 %
Bilirubin Total: 0.4 mg/dL (ref 0.0–1.2)
Calcium: 10.1 mg/dL (ref 8.7–10.2)
Chloride: 102 mmol/L (ref 96–106)
Chol/HDL Ratio: 4.1 ratio (ref 0.0–5.0)
Cholesterol, Total: 191 mg/dL (ref 100–199)
Creatinine, Ser: 1.09 mg/dL (ref 0.76–1.27)
EOS (ABSOLUTE): 0.2 10*3/uL (ref 0.0–0.4)
Eos: 4 %
Estimated CHD Risk: 0.8 times avg. (ref 0.0–1.0)
Free Thyroxine Index: 2 (ref 1.2–4.9)
GGT: 27 IU/L (ref 0–65)
Globulin, Total: 2.7 g/dL (ref 1.5–4.5)
Glucose: 83 mg/dL (ref 70–99)
HDL: 47 mg/dL (ref >39)
Hematocrit: 44.1 % (ref 37.5–51.0)
Hemoglobin: 15.1 g/dL (ref 13.0–17.7)
Immature Grans (Abs): 0 10*3/uL (ref 0.0–0.1)
Immature Granulocytes: 1 %
Iron: 98 ug/dL (ref 38–169)
LDH: 166 IU/L (ref 121–224)
LDL Chol Calc (NIH): 132 mg/dL — ABNORMAL HIGH (ref 0–99)
Lymphocytes Absolute: 2.6 10*3/uL (ref 0.7–3.1)
Lymphs: 44 %
MCH: 29 pg (ref 26.6–33.0)
MCHC: 34.2 g/dL (ref 31.5–35.7)
MCV: 85 fL (ref 79–97)
Monocytes Absolute: 0.7 10*3/uL (ref 0.1–0.9)
Monocytes: 12 %
Neutrophils Absolute: 2.2 10*3/uL (ref 1.4–7.0)
Neutrophils: 38 %
Phosphorus: 4.6 mg/dL — ABNORMAL HIGH (ref 2.8–4.1)
Platelets: 296 10*3/uL (ref 150–450)
Potassium: 4.1 mmol/L (ref 3.5–5.2)
RBC: 5.2 x10E6/uL (ref 4.14–5.80)
RDW: 12.5 % (ref 11.6–15.4)
Sodium: 143 mmol/L (ref 134–144)
T3 Uptake Ratio: 29 % (ref 24–39)
T4, Total: 7 ug/dL (ref 4.5–12.0)
TSH: 1.37 u[IU]/mL (ref 0.450–4.500)
Total Protein: 7.3 g/dL (ref 6.0–8.5)
Triglycerides: 62 mg/dL (ref 0–149)
Uric Acid: 6.6 mg/dL (ref 3.8–8.4)
VLDL Cholesterol Cal: 12 mg/dL (ref 5–40)
WBC: 5.8 10*3/uL (ref 3.4–10.8)
eGFR: 98 mL/min/{1.73_m2} (ref >59)

## 2022-11-17 ENCOUNTER — Ambulatory Visit: Payer: Self-pay | Admitting: Physician Assistant

## 2022-11-17 ENCOUNTER — Encounter: Payer: Self-pay | Admitting: Physician Assistant

## 2022-11-17 VITALS — BP 114/69 | HR 55 | Temp 97.6°F | Resp 12 | Ht 68.0 in | Wt 185.0 lb

## 2022-11-17 DIAGNOSIS — Z0000 Encounter for general adult medical examination without abnormal findings: Secondary | ICD-10-CM

## 2022-11-17 LAB — POCT URINALYSIS DIPSTICK
Bilirubin, UA: NEGATIVE
Blood, UA: NEGATIVE
Glucose, UA: NEGATIVE
Ketones, UA: NEGATIVE
Leukocytes, UA: NEGATIVE
Nitrite, UA: NEGATIVE
Protein, UA: NEGATIVE
Spec Grav, UA: 1.015 (ref 1.010–1.025)
Urobilinogen, UA: 0.2 E.U./dL
pH, UA: 6.5 (ref 5.0–8.0)

## 2022-11-17 NOTE — Progress Notes (Signed)
Pt presents today to complete physical, pt denies any issues or concerns at this time/CL,RMA  

## 2022-11-17 NOTE — Progress Notes (Signed)
[facility] ____________________________________________   None    (approximate)  I have reviewed the triage vital signs and the nursing notes.   HISTORY  Chief Complaint No chief complaint on file.  HPI Tyler Sosa is a 22 y.o. male          Past Medical History:  Diagnosis Date   Depression    GERD (gastroesophageal reflux disease)    Seasonal allergic rhinitis due to pollen     Patient Active Problem List   Diagnosis Date Noted   Abdominal bloating 12/31/2021   Abnormal weight gain 12/31/2021   Elevated levels of transaminase & lactic acid dehydrogenase 12/31/2021   Right upper quadrant pain 12/31/2021   Sprain of ulnar collateral ligament of metacarpophalangeal (MCP) joint of left thumb 10/13/2020   Preventative health care 02/02/2019   Gastroesophageal reflux disease     Past Surgical History:  Procedure Laterality Date   TONSILLECTOMY     TONSILLECTOMY AND ADENOIDECTOMY  ~2004   TYMPANOSTOMY TUBE PLACEMENT      Prior to Admission medications   Not on File    Allergies Patient has no known allergies.  Family History  Problem Relation Age of Onset   Asthma Mother    Melanoma Mother    Hyperlipidemia Father    Asthma Sister    Hearing loss Sister     Social History Social History   Tobacco Use   Smoking status: Never    Passive exposure: Never   Smokeless tobacco: Never   Tobacco comments:    Nicotine pouches.   Substance Use Topics   Alcohol use: Yes    Comment: rare beer   Drug use: Never    Review of Systems Constitutional: No fever/chills Eyes: No visual changes. ENT: No sore throat. Cardiovascular: Denies chest pain. Respiratory: Denies shortness of breath. Gastrointestinal: No abdominal pain.  No nausea, no vomiting.  No diarrhea.  No constipation.  GERD Genitourinary: Negative for dysuria. Musculoskeletal: Negative for back pain. Skin: Negative for rash. Neurological: Negative  for headaches, focal weakness or numbness. Psychiatric: Depression  PHYSICAL EXAM:  VITAL SIGNS: BP is 114/69, pulse 55, respiration 12: Temperature 97.6, patient 90% O2 sat on room air.  Patient weighs 185 pounds and BMI is 28.13. Constitutional: Alert and oriented. Well appearing and in no acute distress. Eyes: Conjunctivae are normal. PERRL. EOMI. Head: Atraumatic. Nose: No congestion/rhinnorhea. Mouth/Throat: Mucous membranes are moist.  Oropharynx non-erythematous. Neck: No stridor.  No cervical spine tenderness to palpation. Hematological/Lymphatic/Immunilogical: No cervical lymphadenopathy. Cardiovascular: Normal rate, regular rhythm. Grossly normal heart sounds.  Good peripheral circulation. Respiratory: Normal respiratory effort.  No retractions. Lungs CTAB. Gastrointestinal: Soft and nontender. No distention. No abdominal bruits. No CVA tenderness. Genitourinary: Deferred Musculoskeletal: No lower extremity tenderness nor edema.  No joint effusions. Neurologic:  Normal speech and language. No gross focal neurologic deficits are appreciated. No gait instability. Skin:  Skin is warm, dry and intact. No rash noted. Psychiatric: Mood and affect are normal. Speech and behavior are normal.  ____________________________________________   LABS        Component Ref Range & Units 5 d ago (11/12/22) 10 mo ago (12/31/21) 4 yr ago (06/16/18) 4 yr ago (04/18/18)  Glucose 70 - 99 mg/dL 83 64 Low     Uric Acid 3.8 - 8.4 mg/dL 6.6 6.7 CM    Comment:            Therapeutic target for gout patients: <6.0  BUN 6 -  20 mg/dL 9 15    Creatinine, Ser 0.76 - 1.27 mg/dL 0.48 8.89    eGFR >16 XI/HWT/8.88 98 101    BUN/Creatinine Ratio 9 - 20 8 Low  14    Sodium 134 - 144 mmol/L 143 140    Potassium 3.5 - 5.2 mmol/L 4.1 4.3    Chloride 96 - 106 mmol/L 102 101    Calcium 8.7 - 10.2 mg/dL 28.0 9.7    Phosphorus 2.8 - 4.1 mg/dL 4.6 High  4.0    Total Protein 6.0 - 8.5 g/dL 7.3 7.1 7.3  R 7.1 R  Albumin 4.3 - 5.2 g/dL 4.6 4.7 R    Globulin, Total 1.5 - 4.5 g/dL 2.7 2.4    Albumin/Globulin Ratio 1.2 - 2.2 1.7 2.0    Bilirubin Total 0.0 - 1.2 mg/dL 0.4 0.3 0.5 R 0.4 R  Alkaline Phosphatase 44 - 121 IU/L 81 91    LDH 121 - 224 IU/L 166 175    AST 0 - 40 IU/L 20 26 17  R 24 R  ALT 0 - 44 IU/L 21 32 16 R 26 R  GGT 0 - 65 IU/L 27 28    Iron 38 - 169 ug/dL 98 81    Cholesterol, Total 100 - 199 mg/dL 034 917 High     Triglycerides 0 - 149 mg/dL 62 915    HDL >05 mg/dL 47 47    VLDL Cholesterol Cal 5 - 40 mg/dL 12 20    LDL Chol Calc (NIH) 0 - 99 mg/dL 697 High  948 High     Chol/HDL Ratio 0.0 - 5.0 ratio 4.1 4.3 CM    Comment:                                   T. Chol/HDL Ratio                                             Men  Women                               1/2 Avg.Risk  3.4    3.3                                   Avg.Risk  5.0    4.4                                2X Avg.Risk  9.6    7.1                                3X Avg.Risk 23.4   11.0  Estimated CHD Risk 0.0 - 1.0 times avg. 0.8 0.8 CM    Comment: The CHD Risk is based on the T. Chol/HDL ratio. Other factors affect CHD Risk such as hypertension, smoking, diabetes, severe obesity, and family history of premature CHD.  TSH 0.450 - 4.500 uIU/mL 1.370 1.680    T4, Total 4.5 - 12.0 ug/dL 7.0 6.3    T3 Uptake Ratio 24 - 39 % 29  24    Free Thyroxine Index 1.2 - 4.9 2.0 1.5    WBC 3.4 - 10.8 x10E3/uL 5.8 5.9    RBC 4.14 - 5.80 x10E6/uL 5.20 5.62    Hemoglobin 13.0 - 17.7 g/dL 16.115.1 09.616.1    Hematocrit 37.5 - 51.0 % 44.1 46.8    MCV 79 - 97 fL 85 83    MCH 26.6 - 33.0 pg 29.0 28.6    MCHC 31.5 - 35.7 g/dL 04.534.2 40.934.4    RDW 81.111.6 - 15.4 % 12.5 12.8    Platelets 150 - 450 x10E3/uL 296 283    Neutrophils Not Estab. % 38 48    Lymphs Not Estab. % 44 34    Monocytes Not Estab. % 12 10    Eos Not Estab. % 4 7    Basos Not Estab. % 1 1    Neutrophils Absolute 1.4 - 7.0 x10E3/uL 2.2  2.9    Lymphocytes Absolute 0.7 - 3.1 x10E3/uL 2.6 2.0    Monocytes Absolute 0.1 - 0.9 x10E3/uL 0.7 0.6    EOS (ABSOLUTE) 0.0 - 0.4 x10E3/uL 0.2 0.4    Basophils Absolute 0.0 - 0.2 x10E3/uL 0.0 0.1    Immature Granulocytes Not Estab. % 1 0    Immature Grans (Abs)              Component Ref Range & Units 15:59 10 mo ago  Color, UA yellow transparent  Clarity, UA clear clear  Glucose, UA Negative Negative Negative  Bilirubin, UA neg negative  Ketones, UA neg negative  Spec Grav, UA 1.010 - 1.025 1.015 1.010  Blood, UA neg negative  pH, UA 5.0 - 8.0 6.5 6.0  Protein, UA Negative Negative Negative  Urobilinogen, UA 0.2 or 1.0 E.U./dL 0.2 0.2  Nitrite, UA neg negative  Leukocytes, UA Negative Negative Negative  Appearance           ____________________________________________    ____________________________________________   INITIAL IMPRESSION / ASSESSMENT AND PLAN  As part of my medical decision making, I reviewed the following data within the electronic MEDICAL RECORD NUMBER       No acute findings on physical exam and lab.      ____________________________________________   FINAL CLINICAL IMPRESSION  Well exam  ED Discharge Orders     None        Note:  This document was prepared using Dragon voice recognition software and may include unintentional dictation errors.

## 2023-02-28 ENCOUNTER — Other Ambulatory Visit: Payer: Self-pay

## 2023-02-28 NOTE — Progress Notes (Signed)
Random UDS and BAT completed per protocol for [facility].

## 2024-01-13 DIAGNOSIS — R7401 Elevation of levels of liver transaminase levels: Secondary | ICD-10-CM | POA: Insufficient documentation

## 2024-01-16 ENCOUNTER — Ambulatory Visit: Payer: Self-pay

## 2024-01-16 DIAGNOSIS — Z0000 Encounter for general adult medical examination without abnormal findings: Secondary | ICD-10-CM

## 2024-01-16 LAB — POCT URINALYSIS DIPSTICK
Bilirubin, UA: NEGATIVE
Blood, UA: NEGATIVE
Glucose, UA: NEGATIVE
Ketones, UA: NEGATIVE
Leukocytes, UA: NEGATIVE
Nitrite, UA: NEGATIVE
Protein, UA: POSITIVE — AB
Spec Grav, UA: 1.025 (ref 1.010–1.025)
Urobilinogen, UA: 0.2 U/dL
pH, UA: 6 (ref 5.0–8.0)

## 2024-01-17 LAB — CMP12+LP+TP+TSH+6AC+CBC/D/PLT
ALT: 23 IU/L (ref 0–44)
AST: 18 IU/L (ref 0–40)
Albumin: 4.8 g/dL (ref 4.3–5.2)
Alkaline Phosphatase: 74 IU/L (ref 44–121)
BUN/Creatinine Ratio: 14 (ref 9–20)
BUN: 13 mg/dL (ref 6–20)
Basophils Absolute: 0 10*3/uL (ref 0.0–0.2)
Basos: 1 %
Bilirubin Total: 0.3 mg/dL (ref 0.0–1.2)
Calcium: 9.8 mg/dL (ref 8.7–10.2)
Chloride: 103 mmol/L (ref 96–106)
Chol/HDL Ratio: 4.6 ratio (ref 0.0–5.0)
Cholesterol, Total: 219 mg/dL — ABNORMAL HIGH (ref 100–199)
Creatinine, Ser: 0.96 mg/dL (ref 0.76–1.27)
EOS (ABSOLUTE): 0.1 10*3/uL (ref 0.0–0.4)
Eos: 1 %
Estimated CHD Risk: 0.9 times avg. (ref 0.0–1.0)
Free Thyroxine Index: 1.8 (ref 1.2–4.9)
GGT: 33 IU/L (ref 0–65)
Globulin, Total: 2.5 g/dL (ref 1.5–4.5)
Glucose: 94 mg/dL (ref 70–99)
HDL: 48 mg/dL (ref >39)
Hematocrit: 46.2 % (ref 37.5–51.0)
Hemoglobin: 15.1 g/dL (ref 13.0–17.7)
Immature Grans (Abs): 0 10*3/uL (ref 0.0–0.1)
Immature Granulocytes: 0 %
Iron: 128 ug/dL (ref 38–169)
LDH: 172 IU/L (ref 121–224)
LDL Chol Calc (NIH): 155 mg/dL — ABNORMAL HIGH (ref 0–99)
Lymphocytes Absolute: 1.7 10*3/uL (ref 0.7–3.1)
Lymphs: 36 %
MCH: 28.9 pg (ref 26.6–33.0)
MCHC: 32.7 g/dL (ref 31.5–35.7)
MCV: 88 fL (ref 79–97)
Monocytes Absolute: 0.5 10*3/uL (ref 0.1–0.9)
Monocytes: 10 %
Neutrophils Absolute: 2.4 10*3/uL (ref 1.4–7.0)
Neutrophils: 52 %
Phosphorus: 3.1 mg/dL (ref 2.8–4.1)
Platelets: 271 10*3/uL (ref 150–450)
Potassium: 4.5 mmol/L (ref 3.5–5.2)
RBC: 5.23 x10E6/uL (ref 4.14–5.80)
RDW: 12.9 % (ref 11.6–15.4)
Sodium: 138 mmol/L (ref 134–144)
T3 Uptake Ratio: 27 % (ref 24–39)
T4, Total: 6.8 ug/dL (ref 4.5–12.0)
TSH: 1.58 u[IU]/mL (ref 0.450–4.500)
Total Protein: 7.3 g/dL (ref 6.0–8.5)
Triglycerides: 87 mg/dL (ref 0–149)
Uric Acid: 6.4 mg/dL (ref 3.8–8.4)
VLDL Cholesterol Cal: 16 mg/dL (ref 5–40)
WBC: 4.7 10*3/uL (ref 3.4–10.8)
eGFR: 113 mL/min/{1.73_m2} (ref >59)

## 2024-01-19 ENCOUNTER — Encounter: Payer: Self-pay | Admitting: Physician Assistant

## 2024-01-19 ENCOUNTER — Ambulatory Visit: Payer: Self-pay | Admitting: Physician Assistant

## 2024-01-19 VITALS — BP 124/73 | HR 91 | Temp 98.0°F | Resp 16 | Ht 68.0 in | Wt 178.0 lb

## 2024-01-19 DIAGNOSIS — Z0000 Encounter for general adult medical examination without abnormal findings: Secondary | ICD-10-CM

## 2024-01-19 NOTE — Progress Notes (Signed)
 [facility]   ____________________________________________   None    (approximate)  I have reviewed the triage vital signs and the nursing notes.   HISTORY  Chief Complaint No chief complaint on file.   HPI Tyler Sosa is a 23 y.o. male patient presents for annual physical exam.  Patient voices no concerns or complaints.         Past Medical History:  Diagnosis Date   Depression    GERD (gastroesophageal reflux disease)    Seasonal allergic rhinitis due to pollen     Patient Active Problem List   Diagnosis Date Noted   High alanine aminotransferase (ALT) level 01/13/2024   Abdominal bloating 12/31/2021   Abnormal weight gain 12/31/2021   Elevated levels of transaminase & lactic acid dehydrogenase 12/31/2021   Right upper quadrant pain 12/31/2021   Sprain of ulnar collateral ligament of metacarpophalangeal (MCP) joint of left thumb 10/13/2020   Preventative health care 02/02/2019   Gastroesophageal reflux disease     Past Surgical History:  Procedure Laterality Date   TONSILLECTOMY     TONSILLECTOMY AND ADENOIDECTOMY  ~2004   TYMPANOSTOMY TUBE PLACEMENT      Prior to Admission medications   Not on File    Allergies Patient has no known allergies.  Family History  Problem Relation Age of Onset   Asthma Mother    Melanoma Mother    Hyperlipidemia Father    Asthma Sister    Hearing loss Sister     Social History Social History   Tobacco Use   Smoking status: Never    Passive exposure: Never   Smokeless tobacco: Never   Tobacco comments:    Nicotine pouches.   Substance Use Topics   Alcohol use: Yes    Comment: rare beer   Drug use: Never    Review of Systems Constitutional: No fever/chills Eyes: No visual changes. ENT: No sore throat. Cardiovascular: Denies chest pain. Respiratory: Denies shortness of breath. Gastrointestinal: No abdominal pain.  No nausea, no vomiting.  No diarrhea.  No  constipation. Genitourinary: Negative for dysuria. Musculoskeletal: Negative for back pain. Skin: Negative for rash. Neurological: Negative for headaches, focal weakness or numbness. ____________________________________________   PHYSICAL EXAM:  VITAL SIGNS: BP 124/73  Cuff Size Normal  Pulse Rate 91  Temp 98 F (36.7 C)  Temp Source Temporal  Weight 178 lb (80.7 kg)  Height 5' 8 (1.727 m)  Resp 16  SpO2 96 %   BMI: 27.06 kg/m2  BSA: 1.97 m2   Constitutional: Alert and oriented. Well appearing and in no acute distress. Eyes: Conjunctivae are normal. PERRL. EOMI. Head: Atraumatic. Nose: No congestion/rhinnorhea. Mouth/Throat: Mucous membranes are moist.  Oropharynx non-erythematous. Neck: No stridor.  No cervical spine tenderness to palpation. Hematological/Lymphatic/Immunilogical: No cervical lymphadenopathy. Cardiovascular: Normal rate, regular rhythm. Grossly normal heart sounds.  Good peripheral circulation. Respiratory: Normal respiratory effort.  No retractions. Lungs CTAB. Gastrointestinal: Soft and nontender. No distention. No abdominal bruits. No CVA tenderness. Genitourinary: Deferred Musculoskeletal: No lower extremity tenderness nor edema.  No joint effusions. Neurologic:  Normal speech and language. No gross focal neurologic deficits are appreciated. No gait instability. Skin:  Skin is warm, dry and intact. No rash noted. Psychiatric: Mood and affect are normal. Speech and behavior are normal.  ____________________________________________   LABS       Component Ref Range & Units (hover) 3 d ago 1 yr ago 2 yr ago  Color, UA dark yellow yellow transparent  Clarity, UA clear clear clear  Glucose, UA Negative Negative Negative  Bilirubin, UA neg neg negative  Ketones, UA neg neg negative  Spec Grav, UA 1.025 1.015 1.010  Blood, UA neg neg negative  pH, UA 6.0 6.5 6.0  Protein, UA Positive Abnormal  Negative Negative  Comment: trace -+  Urobilinogen,  UA 0.2 0.2 0.2  Nitrite, UA neg neg negative  Leukocytes, UA Negative Negative Negative  Appearance     Odor        Component Ref Range & Units (hover) 3 d ago (01/16/24) 1 yr ago (11/12/22) 2 yr ago (12/31/21) 5 yr ago (06/16/18) 5 yr ago (04/18/18)  Glucose 94 83 64 Low     Uric Acid 6.4 6.6 CM 6.7 CM    Comment:            Therapeutic target for gout patients: <6.0  BUN 13 9 15     Creatinine, Ser 0.96 1.09 1.07    eGFR 113 98 101    BUN/Creatinine Ratio 14 8 Low  14    Sodium 138 143 140    Potassium 4.5 4.1 4.3    Chloride 103 102 101    Calcium 9.8 10.1 9.7    Phosphorus 3.1 4.6 High  4.0    Total Protein 7.3 7.3 7.1 7.3 R 7.1 R  Albumin 4.8 4.6 4.7 R    Globulin, Total 2.5 2.7 2.4    Bilirubin Total 0.3 0.4 0.3 0.5 R 0.4 R  Alkaline Phosphatase 74 81 91    LDH 172 166 175    AST 18 20 26 17  R 24 R  ALT 23 21 32 16 R 26 R  GGT 33 27 28    Iron 128 98 81    Cholesterol, Total 219 High  191 203 High     Triglycerides 87 62 113    HDL 48 47 47    VLDL Cholesterol Cal 16 12 20     LDL Chol Calc (NIH) 155 High  132 High  136 High     Chol/HDL Ratio 4.6 4.1 CM 4.3 CM    Comment:                                   T. Chol/HDL Ratio                                             Men  Women                               1/2 Avg.Risk  3.4    3.3                                   Avg.Risk  5.0    4.4                                2X Avg.Risk  9.6    7.1                                3X  Avg.Risk 23.4   11.0  Estimated CHD Risk 0.9 0.8 CM 0.8 CM    Comment: The CHD Risk is based on the T. Chol/HDL ratio. Other factors affect CHD Risk such as hypertension, smoking, diabetes, severe obesity, and family history of premature CHD.  TSH 1.580 1.370 1.680    T4, Total 6.8 7.0 6.3    T3 Uptake Ratio 27 29 24     Free Thyroxine Index 1.8 2.0 1.5    WBC 4.7 5.8 5.9    RBC 5.23 5.20 5.62    Hemoglobin 15.1 15.1 16.1    Hematocrit 46.2 44.1 46.8    MCV 88 85 83    MCH 28.9 29.0 28.6     MCHC 32.7 34.2 34.4    RDW 12.9 12.5 12.8    Platelets 271 296 283    Neutrophils 52 38 48    Lymphs 36 44 34    Monocytes 10 12 10     Eos 1 4 7     Basos 1 1 1     Neutrophils Absolute 2.4 2.2 2.9    Lymphocytes Absolute 1.7 2.6 2.0    Monocytes Absolute 0.5 0.7 0.6    EOS (ABSOLUTE) 0.1 0.2 0.4    Basophils Absolute 0.0 0.0 0.1    Immature Granulocytes 0 1 0    Immature Grans (Abs) 0.0 0.0 0.0    Resulting Agency [facility]             ____________________________________________  EKG  Sinus rhythm at 91 bpm ____________________________________________    ____________________________________________   INITIAL IMPRESSION / ASSESSMENT AND PLAN  As part of my medical decision making, I reviewed the following data within the electronic MEDICAL RECORD NUMBER      No acute findings on physical exam, EKG, labs.        ____________________________________________   FINAL CLINICAL IMPRESSION    ED Discharge Orders          Ordered    EKG 12-Lead        Pending             Note:  This document was prepared using Dragon voice recognition software and may include unintentional dictation errors.

## 2024-01-19 NOTE — Progress Notes (Signed)
 Pt presents today to complete physical, Pt denies any concerns at this time Tyler Sosa

## 2024-07-06 ENCOUNTER — Ambulatory Visit
Admission: EM | Admit: 2024-07-06 | Discharge: 2024-07-06 | Disposition: A | Discharge status: Home / Self Care | Attending: Nurse Practitioner | Admitting: Nurse Practitioner

## 2024-07-06 DIAGNOSIS — L0592 Pilonidal sinus without abscess: Secondary | ICD-10-CM | POA: Insufficient documentation

## 2024-07-06 MED ORDER — SULFAMETHOXAZOLE-TRIMETHOPRIM 800-160 MG PO TABS: 1.0000 | ORAL_TABLET | Freq: Two times a day (BID) | ORAL | 0 refills | Status: AC

## 2024-07-06 MED ORDER — IBUPROFEN 800 MG PO TABS: 800.0000 mg | ORAL_TABLET | Freq: Three times a day (TID) | ORAL | 0 refills | Status: AC | PRN

## 2024-07-06 NOTE — ED Provider Notes (Signed)
 Tyler Sosa    CSN: [id number] Arrival date & time: 07/06/24  0801      History   Chief Complaint Chief Complaint  Patient presents with   Cyst    HPI Tyler Sosa is a 23 y.o. male.   Discussed the use of AI scribe software for clinical note transcription with the patient, who gave verbal consent to proceed.   The patient presents with a sore lesion at the top of the buttock crease that was first noticed yesterday morning after returning home from work. He describes the area as feeling bruised and painful, and reports some drainage from the site. He has never experienced a similar issue in this location before. He denies fever, chills, body aches, nausea, or vomiting. The patient reports no daily medications for medical conditions and has no known allergies.  The following sections of the patient's history were reviewed and updated as appropriate: allergies, current medications, past family history, past medical history, past social history, past surgical history, and problem list.         Past Medical History:  Diagnosis Date   Depression    GERD (gastroesophageal reflux disease)    Seasonal allergic rhinitis due to pollen     Patient Active Problem List   Diagnosis Date Noted   High alanine aminotransferase (ALT) level 01/13/2024   Abdominal bloating 12/31/2021   Abnormal weight gain 12/31/2021   Elevated levels of transaminase & lactic acid dehydrogenase 12/31/2021   Right upper quadrant pain 12/31/2021   Sprain of ulnar collateral ligament of metacarpophalangeal (MCP) joint of left thumb 10/13/2020   Preventative health care 02/02/2019   Gastroesophageal reflux disease     Past Surgical History:  Procedure Laterality Date   TONSILLECTOMY     TONSILLECTOMY AND ADENOIDECTOMY  ~2004   TYMPANOSTOMY TUBE PLACEMENT         Home Medications    Prior to Admission medications   Medication Sig Start Date End Date Taking? Authorizing Provider   ibuprofen (ADVIL) 800 MG tablet Take 1 tablet (800 mg total) by mouth every 8 (eight) hours as needed (pain). Take with food to avoid stomach upset. Do not take any additional NSAIDs while on this. You may take tylenol in addition to this if needed for extra pain relief. 07/06/24  Yes Aleksander Edmiston, FNP  sulfamethoxazole-trimethoprim (BACTRIM DS) 800-160 MG tablet Take 1 tablet by mouth 2 (two) times daily for 7 days. 07/06/24 07/13/24 Yes Iola Lukes, FNP    Family History Family History  Problem Relation Age of Onset   Asthma Mother    Melanoma Mother    Hyperlipidemia Father    Asthma Sister    Hearing loss Sister     Social History Social History   Tobacco Use   Smoking status: Never    Passive exposure: Never   Smokeless tobacco: Never   Tobacco comments:    Nicotine pouches.   Vaping Use   Vaping status: Never Used  Substance Use Topics   Alcohol use: Yes    Comment: rare beer   Drug use: Never     Allergies   Patient has no known allergies.   Review of Systems Review of Systems  Constitutional:  Negative for fever.  Gastrointestinal:  Negative for nausea and vomiting.  Musculoskeletal:  Negative for myalgias.  Skin:  Positive for wound.  All other systems reviewed and are negative.    Physical Exam Triage Vital Signs ED Triage Vitals  Encounter Vitals Group  BP 07/06/24 0816 108/79     Girls Systolic BP Percentile --      Girls Diastolic BP Percentile --      Boys Systolic BP Percentile --      Boys Diastolic BP Percentile --      Pulse Rate 07/06/24 0816 69     Resp --      Temp 07/06/24 0816 98.7 F (37.1 C)     Temp Source 07/06/24 0816 Oral     SpO2 07/06/24 0816 97 %     Weight --      Height --      Head Circumference --      Peak Flow --      Pain Score 07/06/24 0818 8     Pain Loc --      Pain Education --      Exclude from Growth Chart --    No data found.  Updated Vital Signs BP 108/79 (BP Location: Left Arm)    Pulse 69   Temp 98.7 F (37.1 C) (Oral)   SpO2 97%   Visual Acuity Right Eye Distance:   Left Eye Distance:   Bilateral Distance:    Right Eye Near:   Left Eye Near:    Bilateral Near:     Physical Exam Vitals reviewed. Exam conducted with a chaperone present.  Constitutional:      General: He is awake. He is not in acute distress.    Appearance: Normal appearance. He is well-developed. He is not ill-appearing, toxic-appearing or diaphoretic.  HENT:     Head: Normocephalic.     Right Ear: Hearing normal.     Left Ear: Hearing normal.     Nose: Nose normal.     Mouth/Throat:     Mouth: Mucous membranes are moist.  Eyes:     General: Vision grossly intact.     Conjunctiva/sclera: Conjunctivae normal.  Cardiovascular:     Rate and Rhythm: Normal rate and regular rhythm.     Heart sounds: Normal heart sounds.  Pulmonary:     Effort: Pulmonary effort is normal.     Breath sounds: Normal breath sounds and air entry.  Musculoskeletal:        General: Normal range of motion.     Cervical back: Normal range of motion and neck supple.  Skin:    General: Skin is warm and dry.     Findings: Wound present.     Comments: A pinpoint wound noted within the superior intergluteal cleft. Gentle manual pressure expresses a small amount of serosanguinous drainage. Surrounding erythema is noted, but there is no underlying fluctuance, induration, or appreciable swelling. The area is tender to palpation but without signs of abscess formation.  Neurological:     General: No focal deficit present.     Mental Status: He is alert and oriented to person, place, and time.  Psychiatric:        Speech: Speech normal.        Behavior: Behavior is cooperative.      UC Treatments / Results  Labs (all labs ordered are listed, but only abnormal results are displayed) Labs Reviewed  AEROBIC CULTURE W GRAM STAIN (SUPERFICIAL SPECIMEN)    EKG   Radiology No results  found.  Procedures Procedures (including critical care time)  Medications Ordered in UC Medications - No data to display  Initial Impression / Assessment and Plan / UC Course  I have reviewed the triage vital signs and the nursing  notes.  Pertinent labs & imaging results that were available during my care of the patient were reviewed by me and considered in my medical decision making (see chart for details).     Patient presents with a tender gluteal lesion within the superior intergluteal cleft consistent with a pilonidal sinus with early infection. Symptoms began yesterday and include localized tenderness and drainage without systemic signs of illness. Examination reveals a pinpoint wound with serosanguinous drainage and surrounding erythema, without fluctuance or induration to suggest abscess formation. A wound culture was obtained to guide therapy. Empiric treatment with Bactrim DS was initiated, and results will be reviewed with the plan to notify the patient only if a change in therapy is required; otherwise, results may be viewed in MyChart. Warm moist compresses were recommended to encourage drainage, and the patient was instructed to avoid squeezing or manipulating the area. Ibuprofen was prescribed for pain management with instructions to avoid additional OTC NSAIDs. Patient was advised to follow up with primary care if symptoms do not improve or worsen and to seek emergency evaluation if fever, spreading redness, severe pain, or signs of systemic illness develop.  Today's evaluation has revealed no signs of a dangerous process. Discussed diagnosis with patient and/or guardian. Patient and/or guardian aware of their diagnosis, possible red flag symptoms to watch out for and need for close follow up. Patient and/or guardian understands verbal and written discharge instructions. Patient and/or guardian comfortable with plan and disposition.  Patient and/or guardian has a clear mental status  at this time, good insight into illness (after discussion and teaching) and has clear judgment to make decisions regarding their care  Documentation was completed with the aid of voice recognition software. Transcription may contain typographical errors.  Final Clinical Impressions(s) / UC Diagnoses   Final diagnoses:  Pilonidal sinus without abscess     Discharge Instructions      You were seen today for a sore spot at the top of your buttocks that appears to be a small infected pilonidal sinus. A sample of the drainage was sent for culture to help identify any bacteria, and I started you on an antibiotic called Bactrim to treat the infection. If the culture shows that a different antibiotic is needed, we will contact you; if not, you can view the results in your MyChart. At home, place warm moist compresses over the area several times a day to help encourage natural drainage. Do not squeeze, mash, or pick at the area, as this can worsen the infection. You may take the prescribed ibuprofen for pain, but avoid taking any additional over-the-counter NSAIDs like Advil, Motrin, or Aleve while using this medication. If needed, you may take Tylenol (acetaminophen) 1000 mg every six hours for additional pain relief. This equals two 500 mg tablets at a time. Be careful not to take more than 4000 mg of Tylenol in a 24-hour period. Keep the area clean and dry, and consider using mild soap and warm water when showering. Follow up with your primary care provider if the drainage continues beyond a few days, if the pain or redness does not improve, or if this type of bump returns in the future. Seek emergency care right away if you develop a fever, rapidly worsening redness or swelling, severe pain, spreading warmth, feeling very ill, or any concerns that the infection is getting worse.     ED Prescriptions     Medication Sig Dispense Auth. Provider   sulfamethoxazole-trimethoprim (BACTRIM DS) 800-160 MG  tablet  Take 1 tablet by mouth 2 (two) times daily for 7 days. 14 tablet Gracious Renken, [place], FNP   ibuprofen (ADVIL) 800 MG tablet Take 1 tablet (800 mg total) by mouth every 8 (eight) hours as needed (pain). Take with food to avoid stomach upset. Do not take any additional NSAIDs while on this. You may take tylenol in addition to this if needed for extra pain relief. 21 tablet Iola Lukes, FNP      PDMP not reviewed this encounter.   Iola [place], [place] 07/06/24 [phone]

## 2024-07-06 NOTE — ED Triage Notes (Signed)
 Pt c/o possible cyst x1day  Pt states that he was working and noticed soreness along his buttocks. Pt came home and checked and found blood.  Pt wife states that she found two small holes at the top of the buttcrack

## 2024-07-06 NOTE — Discharge Instructions (Addendum)
 You were seen today for a sore spot at the top of your buttocks that appears to be a small infected pilonidal sinus. A sample of the drainage was sent for culture to help identify any bacteria, and I started you on an antibiotic called Bactrim to treat the infection. If the culture shows that a different antibiotic is needed, we will contact you; if not, you can view the results in your MyChart. At home, place warm moist compresses over the area several times a day to help encourage natural drainage. Do not squeeze, mash, or pick at the area, as this can worsen the infection. You may take the prescribed ibuprofen for pain, but avoid taking any additional over-the-counter NSAIDs like Advil, Motrin, or Aleve while using this medication. If needed, you may take Tylenol (acetaminophen) 1000 mg every six hours for additional pain relief. This equals two 500 mg tablets at a time. Be careful not to take more than 4000 mg of Tylenol in a 24-hour period. Keep the area clean and dry, and consider using mild soap and warm water when showering. Follow up with your primary care provider if the drainage continues beyond a few days, if the pain or redness does not improve, or if this type of bump returns in the future. Seek emergency care right away if you develop a fever, rapidly worsening redness or swelling, severe pain, spreading warmth, feeling very ill, or any concerns that the infection is getting worse.

## 2024-07-09 ENCOUNTER — Ambulatory Visit (HOSPITAL_COMMUNITY): Payer: Self-pay | Admitting: Nurse Practitioner

## 2024-07-09 NOTE — Progress Notes (Signed)
 Wound culture positive for bacterial growth with organism sensitive to antibiotic prescribed at urgent care visit. Patient should complete full prescribed antibiotic course to ensure complete treatment of infection. Recommend follow-up with primary care provider for concerns regarding healing or additional issues.

## 2024-07-10 LAB — AEROBIC CULTURE W GRAM STAIN (SUPERFICIAL SPECIMEN): Gram Stain: NONE SEEN
